# Patient Record
Sex: Male | Born: 2000
Health system: Southern US, Community
[De-identification: ages and names within clinical notes are randomized; demographics above are authoritative.]

---
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
## 2019-02-07 ENCOUNTER — Other Ambulatory Visit: Payer: Self-pay

## 2019-02-07 ENCOUNTER — Emergency Department (HOSPITAL_COMMUNITY)
Admission: EM | Admit: 2019-02-07 | Discharge: 2019-02-07 | Disposition: A | Discharge status: Home / Self Care | Payer: 59 | Attending: Emergency Medicine | Admitting: Emergency Medicine

## 2019-02-07 ENCOUNTER — Encounter (HOSPITAL_COMMUNITY): Payer: Self-pay | Admitting: Emergency Medicine

## 2019-02-07 DIAGNOSIS — Z20822 Contact with and (suspected) exposure to covid-19: Secondary | ICD-10-CM | POA: Insufficient documentation

## 2019-02-07 DIAGNOSIS — H9202 Otalgia, left ear: Secondary | ICD-10-CM | POA: Insufficient documentation

## 2019-02-07 DIAGNOSIS — Z5321 Procedure and treatment not carried out due to patient leaving prior to being seen by health care provider: Secondary | ICD-10-CM | POA: Diagnosis not present

## 2019-02-07 NOTE — ED Triage Notes (Signed)
Patient complains of left ear pain x2 weeks. He has been using OTC ear drops without relief. Patients employer told him he may have been exposed to Covid and he is required to be tested prior to return. No other symptoms at this time.

## 2019-06-02 ENCOUNTER — Other Ambulatory Visit: Payer: Self-pay

## 2019-06-02 ENCOUNTER — Emergency Department (HOSPITAL_COMMUNITY)
Admission: EM | Admit: 2019-06-02 | Discharge: 2019-06-02 | Disposition: A | Discharge status: Home / Self Care | Payer: Self-pay | Attending: Emergency Medicine | Admitting: Emergency Medicine

## 2019-06-02 ENCOUNTER — Encounter (HOSPITAL_COMMUNITY): Payer: Self-pay | Admitting: Emergency Medicine

## 2019-06-02 ENCOUNTER — Emergency Department (HOSPITAL_COMMUNITY): Payer: Self-pay

## 2019-06-02 DIAGNOSIS — R0789 Other chest pain: Secondary | ICD-10-CM | POA: Insufficient documentation

## 2019-06-02 DIAGNOSIS — G4700 Insomnia, unspecified: Secondary | ICD-10-CM | POA: Insufficient documentation

## 2019-06-02 DIAGNOSIS — F329 Major depressive disorder, single episode, unspecified: Secondary | ICD-10-CM | POA: Insufficient documentation

## 2019-06-02 DIAGNOSIS — R079 Chest pain, unspecified: Secondary | ICD-10-CM

## 2019-06-02 DIAGNOSIS — F17290 Nicotine dependence, other tobacco product, uncomplicated: Secondary | ICD-10-CM | POA: Insufficient documentation

## 2019-06-02 DIAGNOSIS — F419 Anxiety disorder, unspecified: Secondary | ICD-10-CM | POA: Insufficient documentation

## 2019-06-02 DIAGNOSIS — Z79899 Other long term (current) drug therapy: Secondary | ICD-10-CM | POA: Insufficient documentation

## 2019-06-02 DIAGNOSIS — F418 Other specified anxiety disorders: Secondary | ICD-10-CM

## 2019-06-02 LAB — BASIC METABOLIC PANEL
Anion gap: 9 (ref 5–15)
BUN: 13 mg/dL (ref 6–20)
CO2: 26 mmol/L (ref 22–32)
Calcium: 9.5 mg/dL (ref 8.9–10.3)
Chloride: 104 mmol/L (ref 98–111)
Creatinine, Ser: 0.88 mg/dL (ref 0.61–1.24)
GFR calc Af Amer: >60 mL/min (ref >60)
GFR calc non Af Amer: >60 mL/min (ref >60)
Glucose, Bld: 88 mg/dL (ref 70–99)
Potassium: 4.2 mmol/L (ref 3.5–5.1)
Sodium: 139 mmol/L (ref 135–145)

## 2019-06-02 LAB — TROPONIN I (HIGH SENSITIVITY)
Troponin I (High Sensitivity): <2 ng/L (ref <18)
Troponin I (High Sensitivity): <2 ng/L (ref <18)

## 2019-06-02 LAB — CBC
HCT: 47.8 % (ref 39.0–52.0)
Hemoglobin: 15.9 g/dL (ref 13.0–17.0)
MCH: 30.8 pg (ref 26.0–34.0)
MCHC: 33.3 g/dL (ref 30.0–36.0)
MCV: 92.6 fL (ref 80.0–100.0)
Platelets: 251 10*3/uL (ref 150–400)
RBC: 5.16 MIL/uL (ref 4.22–5.81)
RDW: 12.1 % (ref 11.5–15.5)
WBC: 4 10*3/uL (ref 4.0–10.5)
nRBC: 0 % (ref 0.0–0.2)

## 2019-06-02 LAB — HIV ANTIBODY (ROUTINE TESTING W REFLEX): HIV Screen 4th Generation wRfx: NONREACTIVE

## 2019-06-02 MED ORDER — HYDROXYZINE HCL 25 MG PO TABS: 25.0000 mg | ORAL_TABLET | Freq: Three times a day (TID) | ORAL | 0 refills | Status: DC | PRN

## 2019-06-02 NOTE — Clinical Social Work Note (Signed)
Transition of Care [facility]) - Emergency Department Mini Assessment  Patient Details  Name: Joseph Mitchell MRN: [id number] Date of Birth: 12/07/2000  Transition of Care [facility]) CM/SW Contact:    Ewing Schlein, LCSW Phone Number: 06/02/2019, 5:49 PM  Clinical Narrative: [facility] received consult for patient requesting outpatient mental health resources for depression and anxiety. CSW provided patient with Medicaid application website and encouraged him to submit an application as many behavioral health providers in the community accept Medicaid. CSW also gave patient a list of outpatient mental health providers for [facility].  ED Mini Assessment: What brought you to the Emergency Department? : Chest pain and left arm tingling  Barriers to Discharge: Inadequate or no insurance, Other (comment)(In need of mental health resources)  Barrier interventions: Provided website to start Medicaid application; provided outpatient behavioral health resources  Means of departure: Car  Interventions which prevented an admission or readmission: Other (must enter comment)(Resources for outpatient mental health services and Medicaid application website)  Admission diagnosis:  chest pain left arm tingling There are no problems to display for this patient.  PCP:  Patient, No Pcp Per Pharmacy:   [id number] - [place], [place] - [address] [place] [address] Phone: [phone] Fax: [phone]

## 2019-06-02 NOTE — Progress Notes (Signed)
CSW has reviewed patients chart and notes that patient is without primary care and/or insurance. CSW will follow up with patient regarding this matter and provide assistance as needed.   Joseph Mitchell M. [name] LCSWA Transitions of Care  Clinical Social Worker  Ph: [phone] 

## 2019-06-02 NOTE — ED Provider Notes (Signed)
[facility] EMERGENCY DEPARTMENT Provider Note   CSN: [id number] Arrival date & time: 06/02/19  1311     History No chief complaint on file.   Joseph Mitchell is a 19 y.o. male with no significant past medical history presenting with multiple complaints.  First he describes a 2-week history of tingling midsternal chest pain which radiates into his left shoulder and at times radiates all the way to his left hand.  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     He also describes night sweats, insomnia and increased anxiety with depression.  He denies suicidal ideation and homicidal ideation. His pain is worsened with certain positions. He describes spending a great deal of time at night with increased anxiety and dwelling on past stressful events in his life.  When he does sleep he frequently wakes with head to toe sweats and anxiety.  He has had no recent illnesses, denies headache, cough, sore throat, sinus issues, nausea or vomiting, also denies abdominal pain, no dysuria, no penile discharge or other possible source of fever. He does report reduced appetite.  He reports he was incarcerated last year during which time he was having some issues with depression.  While incarcerated he was seen by [facility], but he states they were not helpful in any way would not choose to return there for follow-up care.  There is no family history of early cardiac history.  Patient smokes, he denies substance abuse, IVDU, no cocaine use.  He occasionally drinks alcohol.       The history is provided by the patient and a friend (fiance at bedside).       History reviewed. No pertinent past medical history.  There are no problems to display for this patient.   History reviewed. No pertinent surgical history.     Family History  Problem Relation Age of Onset  . Diabetes Other     Social History   Tobacco Use  . Smoking status:  Current Every Day Smoker    Types: Cigars  . Smokeless tobacco: Never Used  Substance Use Topics  . Alcohol use: Yes    Comment: occ  . Drug use: No    Home Medications Prior to Admission medications   Medication Sig Start Date End Date Taking? Authorizing Provider  Aspirin-Salicylamide-Caffeine (BC HEADACHE POWDER PO) Take 1 packet by mouth daily as needed (toothache).    [provider]  doxycycline (VIBRAMYCIN) 100 MG capsule Take 1 capsule (100 mg total) by mouth 2 (two) times daily. One po bid x 7 days 04/24/17   Bethann Berkshire, MD  hydrOXYzine (ATARAX/VISTARIL) 25 MG tablet Take 1 tablet (25 mg total) by mouth every 8 (eight) hours as needed for anxiety. 06/02/19   Angelito Hopping, Raynelle Fanning, PA-C  ibuprofen (ADVIL,MOTRIN) 800 MG tablet Take 1 tablet (800 mg total) by mouth every 8 (eight) hours as needed for moderate pain. 04/24/17   Bethann Berkshire, MD    Allergies    Patient has no known allergies.  Review of Systems   Review of Systems  Constitutional: Positive for appetite change and diaphoresis. Negative for fever.  HENT: Negative for congestion and sore throat.   Eyes: Negative.   Respiratory: Negative for chest tightness and shortness of breath.   Cardiovascular: Positive for chest pain.  Gastrointestinal: Negative for abdominal pain, nausea and vomiting.  Genitourinary: Negative.   Musculoskeletal: Positive for arthralgias. Negative for joint swelling and  neck pain.  Skin: Negative.  Negative for rash and wound.  Neurological: Negative for dizziness, weakness, light-headedness, numbness and headaches.  Psychiatric/Behavioral: Positive for sleep disturbance. Negative for hallucinations and suicidal ideas. The patient is nervous/anxious.     Physical Exam Updated Vital Signs BP 131/73 (BP Location: Right Arm)   Pulse 64   Temp 97.8 F (36.6 C) (Oral)   Resp 17   Ht 6' (1.829 m)   Wt 68 kg   SpO2 98%   BMI 20.34 kg/m   Physical Exam  ED Results / Procedures /  Treatments   Labs (all labs ordered are listed, but only abnormal results are displayed) Labs Reviewed  BASIC METABOLIC PANEL  CBC  GC/CHLAMYDIA PROBE AMP ([place]) NOT AT [facility]  TROPONIN I (HIGH SENSITIVITY)  TROPONIN I (HIGH SENSITIVITY)    EKG EKG Interpretation  Date/Time:  Wednesday June 02 2019 13:28:57 EDT Ventricular Rate:  61 PR Interval:  142 QRS Duration: 92 QT Interval:  370 QTC Calculation: 372 R Axis:   93 Text Interpretation: Normal sinus rhythm with sinus arrhythmia Rightward axis Borderline ECG Confirmed by Carmin Muskrat [phone]) on 06/02/2019 4:31:42 PM   Radiology DG Chest 2 View  Result Date: 06/02/2019 CLINICAL DATA:  Chest pain with night sweats 2 weeks. EXAM: CHEST - 2 VIEW COMPARISON:  12/25/2015 FINDINGS: The heart size and mediastinal contours are within normal limits. Both lungs are clear. The visualized skeletal structures are unremarkable. IMPRESSION: No active cardiopulmonary disease. Electronically Signed   By: Marin Olp M.D.   On: 06/02/2019 14:09    Procedures Procedures (including critical care time)  Medications Ordered in ED Medications - No data to display  ED Course  I have reviewed the triage vital signs and the nursing notes.  Pertinent labs & imaging results that were available during my care of the patient were reviewed by me and considered in my medical decision making (see chart for details).    MDM Rules/Calculators/A&P                      Prior to dc, pt asked for std screening. Denies known exposure also no dysuria, no penile dc, but wants to rule this out as source of night sweats.  Gc/chlamydia, HIV pending.  Suspect chest and left arm pain may be msk source given new activity with job using left arm continuously.  No neuro deficits on exam, also cxr, delta troponins negative - no evidence for cardiopulmonary source.  Suspect most of his sx are driven by anxiety/depression and insomnia.  He and fiance both concur  no SI/HI concern but are desirous of assistance with insomnia and anxiety.  He was seen by [facility] last year but felt they were less than helpful.  I have asked social worker to help with community resources for this patient given limited personal resources.  Also have given general community resource list.  Hydroxyzine prescribed for prn and mostly qhs use for insomnia/anxiety.    Final Clinical Impression(s) / ED Diagnoses Final diagnoses:  Chest pain, unspecified type  Insomnia, unspecified type  Anxiety with depression    Rx / DC Orders ED Discharge Orders         Ordered    hydrOXYzine (ATARAX/VISTARIL) 25 MG tablet  Every 8 hours PRN     06/02/19 1659           Landis Martins 06/02/19 1748    Carmin Muskrat, MD 06/03/19 [phone]

## 2019-06-02 NOTE — ED Triage Notes (Signed)
Tingling in LT arm and neck x2 weeks.  Also reports night sweats and chest pain in center of chest x 2 weeks.

## 2019-06-02 NOTE — Discharge Instructions (Addendum)
Your lab tests, ekg, chest xray and exam are reassuring today.  You have been screened for std's today - this should result by tomorrow and if positive, you will be contacted and prescription given.  Since you are not having symptoms suggesting this, however,  I suspect your test will be negative.   You have been prescribed a short course of a medicine to help with anxiety and insomnia - I recommend taking this before bedtime.  You may take it up to 3 times daily but can make you sleepy so I do not recommend taking it when you need to be awake for work or driving.    Refer to the resource guide printed and recommendations per our social worker.

## 2019-06-04 LAB — GC/CHLAMYDIA PROBE AMP (~~LOC~~) NOT AT ARMC
Chlamydia: NEGATIVE
Comment: NEGATIVE
Comment: NORMAL
Neisseria Gonorrhea: NEGATIVE

## 2019-12-11 ENCOUNTER — Ambulatory Visit
Admission: EM | Admit: 2019-12-11 | Discharge: 2019-12-11 | Disposition: A | Discharge status: Home / Self Care | Payer: Self-pay | Attending: Emergency Medicine | Admitting: Emergency Medicine

## 2019-12-11 ENCOUNTER — Encounter: Payer: Self-pay | Admitting: Emergency Medicine

## 2019-12-11 DIAGNOSIS — K0889 Other specified disorders of teeth and supporting structures: Secondary | ICD-10-CM

## 2019-12-11 MED ORDER — AMOXICILLIN-POT CLAVULANATE 875-125 MG PO TABS: 1.0000 | ORAL_TABLET | Freq: Two times a day (BID) | ORAL | 0 refills | Status: DC

## 2019-12-11 MED ORDER — CHLORHEXIDINE GLUCONATE 0.12 % MT SOLN: 15.0000 mL | Freq: Two times a day (BID) | OROMUCOSAL | 0 refills | Status: DC

## 2019-12-11 MED ORDER — NAPROXEN 500 MG PO TABS: 500.0000 mg | ORAL_TABLET | Freq: Two times a day (BID) | ORAL | 0 refills | Status: DC

## 2019-12-11 NOTE — ED Triage Notes (Signed)
Dental pain to RT lower side x 1 week

## 2019-12-11 NOTE — ED Provider Notes (Signed)
[facility]   [id number] 12/11/19 Arrival Time: 1341  CC: DENTAL PAIN  SUBJECTIVE:  Joseph Mitchell is a 19 y.o. male who presents to the urgent care for complaint of dental pain for the past 1 week.  Denies a precipitating event or trauma.  Localizes pain to right lower gum.  Has tried OTC analgesics without relief.  Worse with chewing.  Does not see a dentist regularly.  Report similar symptoms in the past.  Denies fever, chills, dysphagia, odynophagia, oral or neck swelling, nausea, vomiting, chest pain, SOB.    ROS: As per HPI.  All other pertinent ROS negative.     History reviewed. No pertinent past medical history. History reviewed. No pertinent surgical history. No Known Allergies No current facility-administered medications on file prior to encounter.   Current Outpatient Medications on File Prior to Encounter  Medication Sig Dispense Refill  . Aspirin-Salicylamide-Caffeine (BC HEADACHE POWDER PO) Take 1 packet by mouth daily as needed (toothache).    . hydrOXYzine (ATARAX/VISTARIL) 25 MG tablet Take 1 tablet (25 mg total) by mouth every 8 (eight) hours as needed for anxiety. 12 tablet 0  . ibuprofen (ADVIL,MOTRIN) 800 MG tablet Take 1 tablet (800 mg total) by mouth every 8 (eight) hours as needed for moderate pain. 20 tablet 0   Social History   Socioeconomic History  . Marital status: Single    Spouse name: Not on file  . Number of children: Not on file  . Years of education: Not on file  . Highest education level: Not on file  Occupational History  . Not on file  Tobacco Use  . Smoking status: Current Every Day Smoker    Types: Cigars  . Smokeless tobacco: Never Used  Substance and Sexual Activity  . Alcohol use: Yes    Comment: occ  . Drug use: No  . Sexual activity: Not on file  Other Topics Concern  . Not on file  Social History Narrative  . Not on file   Social Determinants of Health   Financial Resource Strain:   . Difficulty of Paying  Living Expenses: Not on file  Food Insecurity:   . Worried About Programme researcher, broadcasting/film/video in the Last Year: Not on file  . Ran Out of Food in the Last Year: Not on file  Transportation Needs:   . Lack of Transportation (Medical): Not on file  . Lack of Transportation (Non-Medical): Not on file  Physical Activity:   . Days of Exercise per Week: Not on file  . Minutes of Exercise per Session: Not on file  Stress:   . Feeling of Stress : Not on file  Social Connections:   . Frequency of Communication with Friends and Family: Not on file  . Frequency of Social Gatherings with Friends and Family: Not on file  . Attends Religious Services: Not on file  . Active Member of Clubs or Organizations: Not on file  . Attends Banker Meetings: Not on file  . Marital Status: Not on file  Intimate Partner Violence:   . Fear of Current or Ex-Partner: Not on file  . Emotionally Abused: Not on file  . Physically Abused: Not on file  . Sexually Abused: Not on file   Family History  Problem Relation Age of Onset  . Diabetes Other     OBJECTIVE:  Vitals:   12/11/19 1354 12/11/19 1355  BP: 138/84   Pulse: 70   Resp: 19   Temp: 98.6 F (37  C)   TempSrc: Oral   SpO2: 97%   Weight:  165 lb (74.8 kg)  Height:  6' (1.829 m)    Physical Exam Vitals and nursing note reviewed.  Constitutional:      General: He is not in acute distress.    Appearance: Normal appearance. He is normal weight. He is not ill-appearing, toxic-appearing or diaphoretic.  HENT:     Head: Normocephalic.     Right Ear: Tympanic membrane, ear canal and external ear normal. There is no impacted cerumen.     Left Ear: Tympanic membrane, ear canal and external ear normal. There is no impacted cerumen.     Mouth/Throat:     Lips: Pink.     Mouth: Mucous membranes are moist.     Dentition: Abnormal dentition.  Cardiovascular:     Rate and Rhythm: Normal rate and regular rhythm.     Pulses: Normal pulses.     Heart  sounds: Normal heart sounds. No murmur heard.  No friction rub. No gallop.   Pulmonary:     Effort: Pulmonary effort is normal. No respiratory distress.     Breath sounds: Normal breath sounds. No stridor. No wheezing, rhonchi or rales.  Chest:     Chest wall: No tenderness.  Neurological:     Mental Status: He is alert and oriented to person, place, and time.      ASSESSMENT & PLAN:  1. Pain, dental     Meds ordered this encounter  Medications  . chlorhexidine (PERIDEX) 0.12 % solution    Sig: Use as directed 15 mLs in the mouth or throat 2 (two) times daily.    Dispense:  473 mL    Refill:  0  . amoxicillin-clavulanate (AUGMENTIN) 875-125 MG tablet    Sig: Take 1 tablet by mouth every 12 (twelve) hours.    Dispense:  14 tablet    Refill:  0  . naproxen (NAPROSYN) 500 MG tablet    Sig: Take 1 tablet (500 mg total) by mouth 2 (two) times daily.    Dispense:  30 tablet    Refill:  0    Discharge instructions  Naproxen prescribed.  Use as directed for pain relief Augmentin was prescribed Chlorhexidine mouthwash was prescribed Recommend soft diet until evaluated by dentist Maintain oral hygiene care Follow up with dentist as soon as possible for further evaluation and treatment  Return or go to the ED if you have any new or worsening symptoms such as fever, chills, difficulty swallowing, painful swallowing, oral or neck swelling, nausea, vomiting, chest pain, SOB, etc...  Reviewed expectations re: course of current medical issues. Questions answered. Outlined signs and symptoms indicating need for more acute intervention. Patient verbalized understanding. After Visit Summary given.   Durward Parcel, FNP 12/11/19 1410

## 2019-12-11 NOTE — Discharge Instructions (Addendum)
Naproxen prescribed.  Use as directed for pain relief Augmentin was prescribed Chlorhexidine mouthwash was prescribed Recommend soft diet until evaluated by dentist Maintain oral hygiene care Follow up with dentist as soon as possible for further evaluation and treatment  Return or go to the ED if you have any new or worsening symptoms such as fever, chills, difficulty swallowing, painful swallowing, oral or neck swelling, nausea, vomiting, chest pain, SOB, etc..Marland Kitchen

## 2020-08-21 ENCOUNTER — Other Ambulatory Visit: Payer: Self-pay

## 2020-08-21 ENCOUNTER — Ambulatory Visit
Admission: EM | Admit: 2020-08-21 | Discharge: 2020-08-21 | Disposition: A | Discharge status: Home / Self Care | Payer: Self-pay | Attending: Family Medicine | Admitting: Family Medicine

## 2020-08-21 ENCOUNTER — Encounter: Payer: Self-pay | Admitting: Emergency Medicine

## 2020-08-21 DIAGNOSIS — Z202 Contact with and (suspected) exposure to infections with a predominantly sexual mode of transmission: Secondary | ICD-10-CM | POA: Insufficient documentation

## 2020-08-21 NOTE — ED Triage Notes (Signed)
Would like to be screened for std's.  No s/s or known exposures

## 2020-08-21 NOTE — ED Notes (Signed)
Pt had to leave to go to work

## 2020-08-22 LAB — CYTOLOGY, (ORAL, ANAL, URETHRAL) ANCILLARY ONLY
Chlamydia: NEGATIVE
Comment: NEGATIVE
Comment: NEGATIVE
Comment: NORMAL
Neisseria Gonorrhea: NEGATIVE
Trichomonas: NEGATIVE

## 2020-08-22 NOTE — ED Provider Notes (Signed)
Patient left prior to providing seeing patient. Patient was here today for STD testing. Nursing staff notified provider that patient left cytology but had to leave for work. Provider was finishing up with another patient    Bing Neighbors, [place] 08/22/20 [phone]

## 2020-10-23 ENCOUNTER — Emergency Department (HOSPITAL_COMMUNITY): Payer: Self-pay

## 2020-10-23 ENCOUNTER — Other Ambulatory Visit: Payer: Self-pay

## 2020-10-23 ENCOUNTER — Emergency Department (HOSPITAL_COMMUNITY)
Admission: EM | Admit: 2020-10-23 | Discharge: 2020-10-23 | Disposition: A | Discharge status: Home / Self Care | Payer: Self-pay | Attending: Emergency Medicine | Admitting: Emergency Medicine

## 2020-10-23 DIAGNOSIS — F17290 Nicotine dependence, other tobacco product, uncomplicated: Secondary | ICD-10-CM | POA: Insufficient documentation

## 2020-10-23 DIAGNOSIS — T1490XA Injury, unspecified, initial encounter: Secondary | ICD-10-CM

## 2020-10-23 DIAGNOSIS — W500XXA Accidental hit or strike by another person, initial encounter: Secondary | ICD-10-CM | POA: Insufficient documentation

## 2020-10-23 DIAGNOSIS — Z7982 Long term (current) use of aspirin: Secondary | ICD-10-CM | POA: Insufficient documentation

## 2020-10-23 DIAGNOSIS — Y9367 Activity, basketball: Secondary | ICD-10-CM | POA: Insufficient documentation

## 2020-10-23 DIAGNOSIS — M25572 Pain in left ankle and joints of left foot: Secondary | ICD-10-CM | POA: Insufficient documentation

## 2020-10-23 NOTE — Discharge Instructions (Signed)
Call your primary care doctor or specialist as discussed in the next 2-3 days.   Return immediately back to the ER if:  Your symptoms worsen within the next 12-24 hours. You develop new symptoms such as new fevers, persistent vomiting, new pain, shortness of breath, or new weakness or numbness, or if you have any other concerns.  

## 2020-10-23 NOTE — ED Notes (Signed)
Patient ambulated to room with no assistance needed.

## 2020-10-23 NOTE — ED Provider Notes (Signed)
[facility] EMERGENCY DEPARTMENT Provider Note   CSN: [id number] Arrival date & time: 10/23/20  2106     History Chief Complaint  Patient presents with   Ankle Pain    Left    Joseph Mitchell is a 20 y.o. male.  Patient complaining of left ankle pain.  He states he was doing basketball earlier today when somebody fell down onto his left ankle causing severe pain.  Denies loss of consciousness or injury elsewhere.  He states he did injure it several months ago in the past      No past medical history on file.  There are no problems to display for this patient.   No past surgical history on file.     Family History  Problem Relation Age of Onset   Diabetes Other     Social History   Tobacco Use   Smoking status: Every Day    Types: Cigars   Smokeless tobacco: Never  Substance Use Topics   Alcohol use: Yes    Comment: occ   Drug use: No    Home Medications Prior to Admission medications   Medication Sig Start Date End Date Taking? Authorizing Provider  amoxicillin-clavulanate (AUGMENTIN) 875-125 MG tablet Take 1 tablet by mouth every 12 (twelve) hours. Patient not taking: No sig reported 12/11/19   Durward Parcel, FNP  Aspirin-Salicylamide-Caffeine (BC HEADACHE POWDER PO) Take 1 packet by mouth daily as needed (toothache). Patient not taking: Reported on 10/23/2020    [provider]  chlorhexidine (PERIDEX) 0.12 % solution Use as directed 15 mLs in the mouth or throat 2 (two) times daily. Patient not taking: No sig reported 12/11/19   Durward Parcel, FNP  hydrOXYzine (ATARAX/VISTARIL) 25 MG tablet Take 1 tablet (25 mg total) by mouth every 8 (eight) hours as needed for anxiety. Patient not taking: No sig reported 06/02/19   Burgess Amor, PA-C  ibuprofen (ADVIL,MOTRIN) 800 MG tablet Take 1 tablet (800 mg total) by mouth every 8 (eight) hours as needed for moderate pain. Patient not taking: No sig reported 04/24/17   Bethann Berkshire, MD  naproxen  (NAPROSYN) 500 MG tablet Take 1 tablet (500 mg total) by mouth 2 (two) times daily. Patient not taking: No sig reported 12/11/19   Durward Parcel, FNP    Allergies    Patient has no known allergies.  Review of Systems   Review of Systems  Constitutional:  Negative for fever.  HENT:  Negative for ear pain and sore throat.   Eyes:  Negative for pain.  Respiratory:  Negative for cough.   Cardiovascular:  Negative for chest pain.  Gastrointestinal:  Negative for abdominal pain.  Genitourinary:  Negative for flank pain.  Musculoskeletal:  Negative for back pain.  Skin:  Negative for color change and rash.  Neurological:  Negative for syncope.  All other systems reviewed and are negative.  Physical Exam Updated Vital Signs BP 136/72 (BP Location: Right Arm)   Pulse 84   Temp 97.8 F (36.6 C)   Resp 20   Ht 6' (1.829 m)   Wt 74.8 kg   SpO2 97%   BMI 22.38 kg/m   Physical Exam Constitutional:      Appearance: He is well-developed.  HENT:     Head: Normocephalic.     Nose: Nose normal.  Eyes:     Extraocular Movements: Extraocular movements intact.  Cardiovascular:     Rate and Rhythm: Normal rate.  Pulmonary:     Effort:  Pulmonary effort is normal.  Musculoskeletal:     Comments: Pain with range of motion of the left ankle.  Swelling present to left ankle.  Tenderness to medial and lateral malleoli present.  Neurovascularly intact and compartments are otherwise soft.  Skin:    Coloration: Skin is not jaundiced.  Neurological:     Mental Status: He is alert. Mental status is at baseline.    ED Results / Procedures / Treatments   Labs (all labs ordered are listed, but only abnormal results are displayed) Labs Reviewed - No data to display  EKG None  Radiology DG Ankle Left Port  Result Date: 10/23/2020 CLINICAL DATA:  Left lateral ankle pain, injury. EXAM: PORTABLE LEFT ANKLE - 2 VIEW COMPARISON:  None. FINDINGS: There is soft tissue swelling surrounding the  ankle and joint effusion. There is a well corticated density adjacent to the tip of the medial malleolus. This may related to old injury. No definite acute fractures are seen. Joint spaces are well maintained. IMPRESSION: 1. Well corticated density adjacent to the tip of the medial malleolus may be related to old injury. Please correlate clinically. 2. Soft tissue swelling and joint effusion. Electronically Signed   By: Darliss Cheney M.D.   On: 10/23/2020 21:32    Procedures .Ortho Injury Treatment  Date/Time: 10/23/2020 10:53 PM Performed by: Cheryll Cockayne, MD Authorized by: Cheryll Cockayne, MD  Post-procedure neurovascular assessment: post-procedure neurovascularly intact Comments: Placement of air cast to left ankle      Medications Ordered in ED Medications - No data to display  ED Course  I have reviewed the triage vital signs and the nursing notes.  Pertinent labs & imaging results that were available during my care of the patient were reviewed by me and considered in my medical decision making (see chart for details).    MDM Rules/Calculators/A&P                           X-rays show evidence of prior injury with no acute findings per radiology.  Patient placed in an Aircast with improvement of symptoms.  Given crutches.  Advise follow-up with his doctor within the week.  Advised immediate return for worsening pain or any additional concerns.  Final Clinical Impression(s) / ED Diagnoses Final diagnoses:  Injury  Acute left ankle pain    Rx / DC Orders ED Discharge Orders     None        Cheryll Cockayne, MD 10/23/20 2254

## 2020-10-23 NOTE — ED Triage Notes (Signed)
Pt c/o left ankle pain after sustaining injury to while playing basketball yesterday. States a player landed on his leg rotating his ankle inward.

## 2021-07-07 ENCOUNTER — Encounter (HOSPITAL_COMMUNITY): Payer: Self-pay

## 2021-07-07 ENCOUNTER — Other Ambulatory Visit: Payer: Self-pay

## 2021-07-07 ENCOUNTER — Emergency Department (HOSPITAL_COMMUNITY)
Admission: EM | Admit: 2021-07-07 | Discharge: 2021-07-07 | Disposition: A | Discharge status: Home / Self Care | Payer: Self-pay | Attending: Emergency Medicine | Admitting: Emergency Medicine

## 2021-07-07 DIAGNOSIS — Z202 Contact with and (suspected) exposure to infections with a predominantly sexual mode of transmission: Secondary | ICD-10-CM | POA: Insufficient documentation

## 2021-07-07 DIAGNOSIS — Z711 Person with feared health complaint in whom no diagnosis is made: Secondary | ICD-10-CM

## 2021-07-07 DIAGNOSIS — L723 Sebaceous cyst: Secondary | ICD-10-CM | POA: Insufficient documentation

## 2021-07-07 LAB — HIV ANTIBODY (ROUTINE TESTING W REFLEX): HIV Screen 4th Generation wRfx: NONREACTIVE

## 2021-07-07 LAB — URINALYSIS, ROUTINE W REFLEX MICROSCOPIC
Bilirubin Urine: NEGATIVE
Glucose, UA: NEGATIVE mg/dL
Hgb urine dipstick: NEGATIVE
Ketones, ur: 20 mg/dL — AB
Leukocytes,Ua: NEGATIVE
Nitrite: NEGATIVE
Protein, ur: NEGATIVE mg/dL
Specific Gravity, Urine: 1.02 (ref 1.005–1.030)
pH: 6 (ref 5.0–8.0)

## 2021-07-07 NOTE — Discharge Instructions (Addendum)
The nodules on your scrotum are sebaceous (epidermoid) cysts. These are not dangerous and not due to an infection. You can follow up with the Urologist listed below if you would like to discuss having them removed.

## 2021-07-07 NOTE — ED Triage Notes (Signed)
Pt presents with complaints of with bump like areas on scrotum areas. Pt does not know when they appeared. Not very informative.

## 2021-07-07 NOTE — ED Provider Notes (Signed)
   [facility] EMERGENCY DEPARTMENT  Provider Note  CSN: [id number] Arrival date & time: 07/07/21 1275  History No chief complaint on file.   Atharva Dewalt is a 21 y.o. male here with his significant other for evaluation of lesions on his scrotum. Patient states they have been there for a long time, significant other states it has been more recent and she is concerned about STI. Patient denies any pain, discharge or dysuria but wants to be 'checked for everything' to appease her.    Home Medications Prior to Admission medications   Not on File     Allergies    Patient has no known allergies.   Review of Systems   Review of Systems Please see HPI for pertinent positives and negatives  Physical Exam BP (!) 155/89   Pulse 75   Temp 97.8 F (36.6 C) (Oral)   Resp 18   SpO2 100%   Physical Exam Vitals and nursing note reviewed.  Constitutional:      Appearance: Normal appearance.  HENT:     Head: Normocephalic and atraumatic.     Nose: Nose normal.     Mouth/Throat:     Mouth: Mucous membranes are moist.  Eyes:     Extraocular Movements: Extraocular movements intact.     Conjunctiva/sclera: Conjunctivae normal.  Cardiovascular:     Rate and Rhythm: Normal rate.  Pulmonary:     Effort: Pulmonary effort is normal.     Breath sounds: Normal breath sounds.  Abdominal:     General: Abdomen is flat.     Palpations: Abdomen is soft.     Tenderness: There is no abdominal tenderness.  Genitourinary:    Comments: Numerous non-infected sebaceous cysts on scrotum. No ulcers or sores, no penile discharge. No testicular tenderness or swelling.  Musculoskeletal:        General: No swelling. Normal range of motion.     Cervical back: Neck supple.  Skin:    General: Skin is warm and dry.  Neurological:     General: No focal deficit present.     Mental Status: He is alert.  Psychiatric:        Mood and Affect: Mood normal.    ED Results / Procedures / Treatments    EKG None  Procedures Procedures  Medications Ordered in the ED Medications - No data to display  Initial Impression and Plan  Patient with chronic appearing sebaceous cysts on scrotum. No signs of STI, but to address patient's and girlfriend's concerns an STI panel was ordered. Advised he would be called with any positive results. Follow up with Urology regarding his scrotal lesions.   ED Course   Clinical Course as of 07/07/21 0603  Sat Jul 07, 2021  0551 UA is normal.  [CS]    Clinical Course User Index [CS] Pollyann Savoy, MD     MDM Rules/Calculators/A&P Medical Decision Making Problems Addressed: Concern about STD in male without diagnosis: acute illness or injury Sebaceous cyst of scrotum: chronic illness or injury  Amount and/or Complexity of Data Reviewed Labs: ordered. Decision-making details documented in ED Course.    Final Clinical Impression(s) / ED Diagnoses Final diagnoses:  Sebaceous cyst of scrotum  Concern about STD in male without diagnosis    Rx / DC Orders ED Discharge Orders     None        Pollyann Savoy, MD 07/07/21 [phone]

## 2021-07-09 LAB — GC/CHLAMYDIA PROBE AMP (~~LOC~~) NOT AT ARMC
Chlamydia: NEGATIVE
Comment: NEGATIVE
Comment: NORMAL
Neisseria Gonorrhea: NEGATIVE

## 2022-03-03 IMAGING — DX DG ANKLE PORT 2V*L*
3 series · 3 of 3 positions shown · non-contrast
Comparison: None.

CLINICAL DATA: Left lateral ankle pain, injury.

EXAM:
PORTABLE LEFT ANKLE - 2 VIEW

[ankle ap]
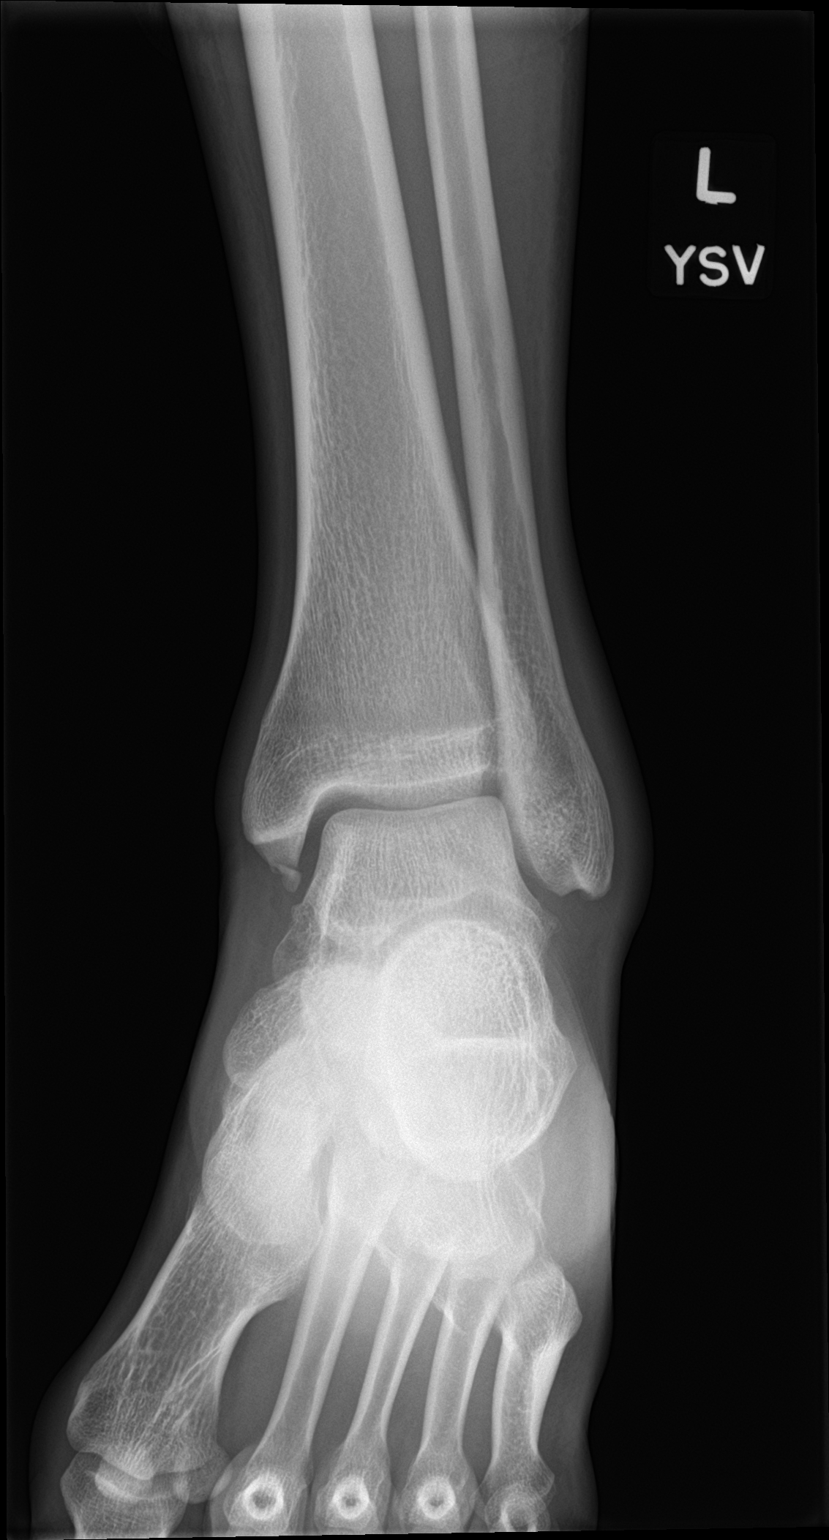

[ankle obl]
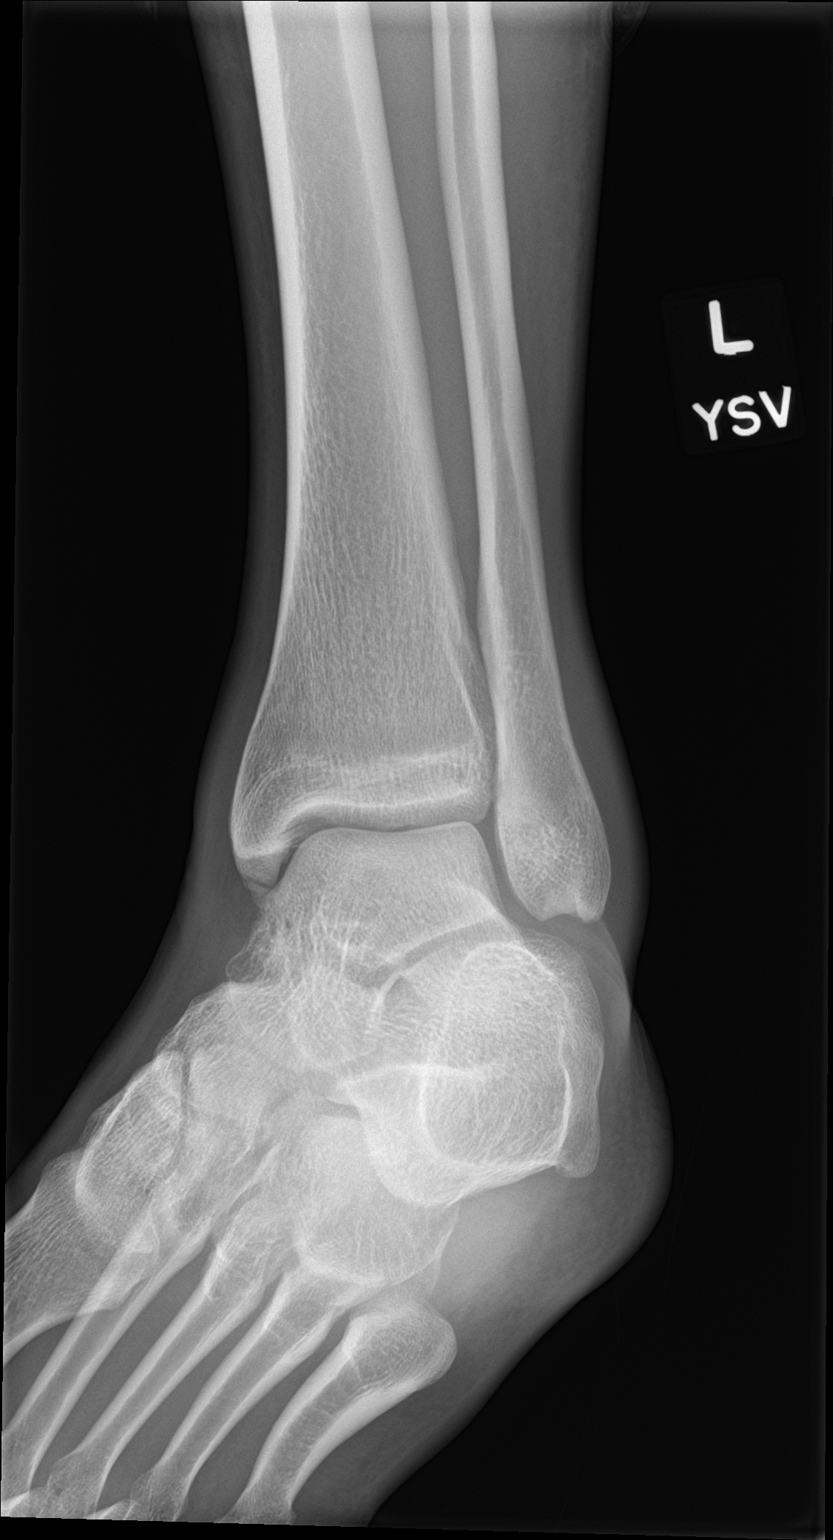

[ankle lat]
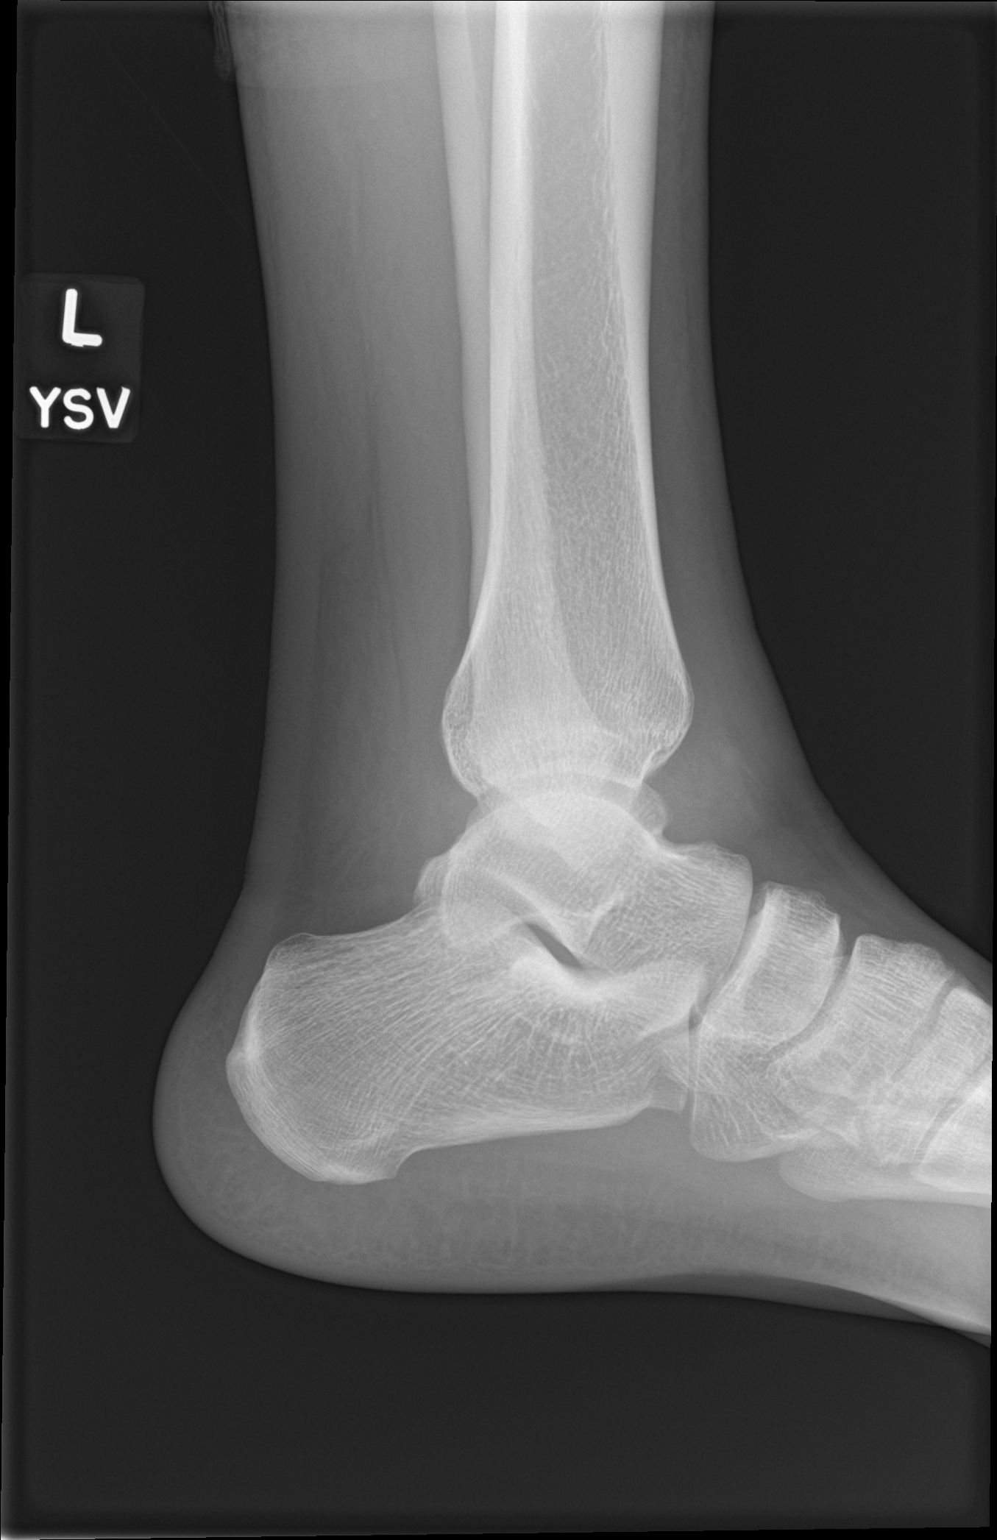

[3 of 3 positions shown; findings below may reference images not displayed]

FINDINGS: There is soft tissue swelling surrounding the ankle and joint
effusion. There is a well corticated density adjacent to the tip of
the medial malleolus. This may related to old injury. No definite
acute fractures are seen. Joint spaces are well maintained.
IMPRESSION: 1. Well corticated density adjacent to the tip of the medial
malleolus may be related to old injury. Please correlate clinically.
2. Soft tissue swelling and joint effusion.

## 2023-01-10 ENCOUNTER — Ambulatory Visit
Admission: EM | Admit: 2023-01-10 | Discharge: 2023-01-10 | Disposition: A | Discharge status: Home / Self Care | Payer: Self-pay | Attending: Nurse Practitioner | Admitting: Nurse Practitioner

## 2023-01-10 DIAGNOSIS — K1370 Unspecified lesions of oral mucosa: Secondary | ICD-10-CM | POA: Insufficient documentation

## 2023-01-10 MED ORDER — VALACYCLOVIR HCL 1 G PO TABS: 2.0000 g | ORAL_TABLET | Freq: Two times a day (BID) | ORAL | 0 refills | Status: AC

## 2023-01-10 NOTE — Discharge Instructions (Signed)
Cytology swab is pending.  You will be contacted if the pending test result is abnormal.  You will also have access to the results via MyChart. Take medication as prescribed. Apply cool compresses to the affected area for the next 24 hours.  Apply for 20 minutes, remove for 1 hour, repeat as much as possible. May take over-the-counter Tylenol or ibuprofen as needed for pain, fever, or general discomfort. Refrain from kissing or drinking after others while symptoms persist. If your symptoms are not improved over the next 24 hours, please follow-up in this clinic or with your primary care physician for further evaluation. Follow-up as needed.

## 2023-01-10 NOTE — ED Provider Notes (Signed)
[facility]    CSN: [id number] Arrival date & time: 01/10/23  1327      History   Chief Complaint Chief Complaint  Patient presents with   Oral Swelling    HPI Joseph Mitchell is a 22 y.o. male.   The history is provided by the patient.   Patient presents for complaints of swelling to the left side of his upper lip that started over the past 24 hours.  Patient states that his girlfriend thought it was a "hair bump" and tried to pull a hair from the area.  Patient states prior to the area on his lip developing, he did have some tingling in the lip.  Patient denies fever, chills, drainage from the site, or recent exposure to STI/STD.  Patient also denies prior history of HSV. Patient states he has been applying warm compresses to the area.    History reviewed. No pertinent past medical history.  There are no problems to display for this patient.   History reviewed. No pertinent surgical history.     Home Medications    Prior to Admission medications   Medication Sig Start Date End Date Taking? Authorizing Provider  valACYclovir (VALTREX) 1000 MG tablet Take 2 tablets (2,000 mg total) by mouth every 12 (twelve) hours for 1 day. 01/10/23 01/11/23 Yes Leath-Warren, Sadie Haber, NP    Family History Family History  Problem Relation Age of Onset   Diabetes Other     Social History Social History   Tobacco Use   Smoking status: Every Day    Types: Cigars   Smokeless tobacco: Never  Substance Use Topics   Alcohol use: Yes    Comment: occ   Drug use: No     Allergies   Patient has no known allergies.   Review of Systems Review of Systems Per HPI  Physical Exam Triage Vital Signs ED Triage Vitals [01/10/23 1340]  Encounter Vitals Group     BP 122/71     Systolic BP Percentile      Diastolic BP Percentile      Pulse Rate (!) 54     Resp 18     Temp 98.8 F (37.1 C)     Temp Source Oral     SpO2 98 %     Weight      Height      Head  Circumference      Peak Flow      Pain Score 5     Pain Loc      Pain Education      Exclude from Growth Chart    No data found.  Updated Vital Signs BP 122/71 (BP Location: Right Arm)   Pulse (!) 54 Comment: states he just smoked a black and mild  Temp 98.8 F (37.1 C) (Oral)   Resp 18   SpO2 98%   Visual Acuity Right Eye Distance:   Left Eye Distance:   Bilateral Distance:    Right Eye Near:   Left Eye Near:    Bilateral Near:     Physical Exam Vitals and nursing note reviewed.  Constitutional:      General: He is not in acute distress.    Appearance: Normal appearance.  HENT:     Head: Normocephalic.     Mouth/Throat:     Lips: Pink.     Mouth: Mucous membranes are moist. Oral lesions present.     Dentition: Normal dentition. Does not have dentures. No dental tenderness,  gingival swelling or dental caries.     Tongue: No lesions.     Pharynx: Oropharynx is clear. Uvula midline.   Eyes:     Extraocular Movements: Extraocular movements intact.     Pupils: Pupils are equal, round, and reactive to light.  Pulmonary:     Effort: Pulmonary effort is normal.  Musculoskeletal:     Cervical back: Normal range of motion.  Skin:    General: Skin is warm and dry.  Neurological:     General: No focal deficit present.     Mental Status: He is alert and oriented to person, place, and time.  Psychiatric:        Mood and Affect: Mood normal.        Behavior: Behavior normal.     UC Treatments / Results  Labs (all labs ordered are listed, but only abnormal results are displayed) Labs Reviewed  HSV CULTURE AND TYPING    EKG   Radiology No results found.  Procedures Procedures (including critical care time)  Medications Ordered in UC Medications - No data to display  Initial Impression / Assessment and Plan / UC Course  I have reviewed the triage vital signs and the nursing notes.  Pertinent labs & imaging results that were available during my care of  the patient were reviewed by me and considered in my medical decision making (see chart for details).  Patient with lesion on the left upper lip.  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  Will treat with valacyclovir 2 g.  HSV culture and typing pending.  Supportive care recommendations were provided and discussed with the patient to include over-the-counter analgesics, refraining from drinking or kissing anyone, and to monitor symptoms for worsening.  Patient advised to follow-up in this clinic or with his PCP if symptoms fail to improve.  Patient was in agreement with this plan of care and verbalizes understanding.  All questions were answered.  Patient stable for discharge.  Final Clinical Impressions(s) / UC Diagnoses   Final diagnoses:  Oral lesion     Discharge Instructions      Cytology swab is pending.  You will be contacted if the pending test result is abnormal.  You will also have access to the results via MyChart. Take medication as prescribed. Apply cool compresses to the affected area for the next 24 hours.  Apply for 20 minutes, remove for 1 hour, repeat as much as possible. May take over-the-counter Tylenol or ibuprofen as needed for pain, fever, or general discomfort. Refrain from kissing or drinking after others while symptoms persist. If your symptoms are not improved over the next 24 hours, please follow-up in this clinic or with your primary care physician for further evaluation. Follow-up as needed.     ED Prescriptions     Medication Sig Dispense Auth. Provider   valACYclovir (VALTREX) 1000 MG tablet Take 2 tablets (2,000 mg total) by mouth every 12 (twelve) hours for 1 day. 4 tablet Leath-Warren, Sadie Haber, NP      PDMP not reviewed this encounter.   Abran Cantor, NP 01/10/23 [phone]

## 2023-01-10 NOTE — ED Triage Notes (Signed)
Pt reports last night his partner noticed his top lip started to swell. This morning he began to feel like his lip was heavy then seen the lump on his lip. He believes something bit him. Size has increased since this morning.   Pt denies trying any new food or medications.

## 2023-01-14 LAB — HSV CULTURE AND TYPING

## 2023-07-02 ENCOUNTER — Other Ambulatory Visit: Payer: Self-pay

## 2023-07-02 ENCOUNTER — Emergency Department (HOSPITAL_COMMUNITY)
Admission: EM | Admit: 2023-07-02 | Discharge: 2023-07-02 | Disposition: A | Discharge status: Home / Self Care | Payer: Self-pay | Attending: Emergency Medicine | Admitting: Emergency Medicine

## 2023-07-02 ENCOUNTER — Encounter (HOSPITAL_COMMUNITY): Payer: Self-pay | Admitting: Emergency Medicine

## 2023-07-02 ENCOUNTER — Emergency Department (HOSPITAL_COMMUNITY): Payer: Self-pay

## 2023-07-02 DIAGNOSIS — Y9389 Activity, other specified: Secondary | ICD-10-CM | POA: Insufficient documentation

## 2023-07-02 DIAGNOSIS — M778 Other enthesopathies, not elsewhere classified: Secondary | ICD-10-CM

## 2023-07-02 DIAGNOSIS — M70831 Other soft tissue disorders related to use, overuse and pressure, right forearm: Secondary | ICD-10-CM | POA: Insufficient documentation

## 2023-07-02 MED ORDER — HYDROCODONE-ACETAMINOPHEN 5-325 MG PO TABS: 1.0000 | ORAL_TABLET | Freq: Four times a day (QID) | ORAL | 0 refills | Status: AC | PRN

## 2023-07-02 MED ORDER — IBUPROFEN 600 MG PO TABS: 600.0000 mg | ORAL_TABLET | Freq: Three times a day (TID) | ORAL | 0 refills | Status: AC

## 2023-07-02 NOTE — ED Triage Notes (Signed)
 Pt c/o of right forearm pain x 5 days. 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 Denies any injuries or falls.

## 2023-07-02 NOTE — ED Provider Notes (Signed)
 [place] EMERGENCY DEPARTMENT AT [facility] Provider Note   CSN: [id number] Arrival date & time: 07/02/23  4098     History  Chief Complaint  Patient presents with   Arm Pain    Joseph Mitchell is a 23 y.o. male presenting for evaluation of right forearm pain and swelling which has been present for approximately 5 days.  He describes working a job which requires repetitive motion with his forearms and has had increasing pain performing his job duties.  He is noticed a localized swelling at the site of maximum pain which is worse with palpation and movement.  He has taken ibuprofen  400 mg and applied ice with no significant improvement.  He denies weakness or numbness or swelling except directly at the site of pain.  Patient is right-handed.  The history is provided by the patient.       Home Medications Prior to Admission medications   Medication Sig Start Date End Date Taking? Authorizing Provider  HYDROcodone -acetaminophen  (NORCO/VICODIN) 5-325 MG tablet Take 1 tablet by mouth every 6 (six) hours as needed for severe pain (pain score 7-10). 07/02/23  Yes April Colter, PA-C  ibuprofen  (ADVIL ) 600 MG tablet Take 1 tablet (600 mg total) by mouth 3 (three) times daily. 07/02/23  Yes Marylyn Appenzeller, PA-C      Allergies    Patient has no known allergies.    Review of Systems   Review of Systems  Constitutional:  Negative for fever.  Musculoskeletal:  Positive for arthralgias. Negative for joint swelling and myalgias.  Skin: Negative.   Neurological:  Negative for weakness and numbness.  All other systems reviewed and are negative.   Physical Exam Updated Vital Signs BP 129/61 (BP Location: Left Arm)   Pulse 60   Temp 98.4 F (36.9 C) (Oral)   Resp 16   Ht 5\' 11"  (1.803 m)   Wt 74.8 kg   SpO2 98%   BMI 23.01 kg/m  Physical Exam Constitutional:      Appearance: He is well-developed.  HENT:     Head: Atraumatic.  Cardiovascular:     Comments: Pulses equal  bilaterally Musculoskeletal:        General: Tenderness present.     Right forearm: Swelling and tenderness present.     Right wrist: Normal. No swelling or deformity.     Cervical back: Normal range of motion.     Comments: Tender to palpation with nodule noted right dorsal distal forearm.  Pain is worsened with right index finger extension, lesser with flexion.  No pain with thumb and long finger ROM.  No crepitus,  skin normal without erythema or lesion.   Skin:    General: Skin is warm and dry.  Neurological:     Mental Status: He is alert.     Sensory: No sensory deficit.     Motor: No weakness.     Deep Tendon Reflexes: Reflexes normal.     ED Results / Procedures / Treatments   Labs (all labs ordered are listed, but only abnormal results are displayed) Labs Reviewed - No data to display  EKG None  Radiology DG Forearm Right Result Date: 07/02/2023 CLINICAL DATA:  Right forearm, two views EXAM: RIGHT FOREARM - 2 VIEW COMPARISON:  None Available. FINDINGS: There is no evidence of fracture or other focal bone lesions. Soft tissues are unremarkable. IMPRESSION: 1. No evidence of acute fracture or malalignment. Electronically Signed   By: Reagan Camera M.D.   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  Suspect extensor tendinitis.  Plain film imaging was obtained, no acute findings with imaging.  Will treat patient with anti-inflammatory, discussed use of ice, he was placed in a wrist splint involving  immobilization of the index finger to rest the tendon as much as possible.  He was advised he will need follow-up with orthopedics and referral was given.  Amount and/or Complexity of Data Reviewed Radiology: ordered.    Details: Reviewed and agree with interpretation, normal imaging.  Risk Prescription drug management.           Final Clinical Impression(s) / ED Diagnoses Final diagnoses:  Tendonitis of elbow or forearm    Rx / DC Orders ED Discharge Orders          Ordered    ibuprofen  (ADVIL ) 600 MG tablet  3 times daily        07/02/23 1041    HYDROcodone -acetaminophen  (NORCO/VICODIN) 5-325 MG tablet  Every 6 hours PRN        07/02/23 1100              Jaesean Litzau, PA-C 07/02/23 1107    Mordecai Applebaum, MD 07/02/23 1149

## 2023-07-02 NOTE — Discharge Instructions (Addendum)
 I am placing you in a splint to help rest this inflamed tendon.  Wear it is much as possible, you may take it off to shower.  You may continue application of ice as much as is comfortable.  Please call Dr. Phyllis Breeze for an office visit for further evaluation and management of this inflamed tendon.

## 2023-08-06 ENCOUNTER — Ambulatory Visit
Admission: RE | Admit: 2023-08-06 | Discharge: 2023-08-06 | Disposition: A | Discharge status: Home / Self Care | Payer: Self-pay | Source: Ambulatory Visit | Attending: Family Medicine | Admitting: Family Medicine

## 2023-08-06 VITALS — BP 120/64 | HR 58 | Temp 98.5°F | Resp 18

## 2023-08-06 DIAGNOSIS — L03011 Cellulitis of right finger: Secondary | ICD-10-CM

## 2023-08-06 DIAGNOSIS — L600 Ingrowing nail: Secondary | ICD-10-CM

## 2023-08-06 MED ORDER — MUPIROCIN 2 % EX OINT: 1.0000 | TOPICAL_OINTMENT | Freq: Two times a day (BID) | CUTANEOUS | 0 refills | Status: AC

## 2023-08-06 MED ORDER — CEPHALEXIN 500 MG PO CAPS: 500.0000 mg | ORAL_CAPSULE | Freq: Four times a day (QID) | ORAL | 0 refills | Status: AC

## 2023-08-06 NOTE — ED Triage Notes (Signed)
 Ingrown nail on right pinky finger since Friday.

## 2023-08-06 NOTE — Discharge Instructions (Signed)
 Take the full course of antibiotics, apply the mupirocin ointment several times daily to the area and you may do warm Epsom salt soaks several times daily until improved.  Follow-up for worsening or unresolving symptoms.

## 2023-08-06 NOTE — ED Provider Notes (Signed)
 [facility]    CSN: [id number] Arrival date & time: 08/06/23  1205      History   Chief Complaint Chief Complaint  Patient presents with   Finger Injury    Ingrown nail in my pinky causing pain whenever I touch just hit it at work on a hose it's little swollen - Entered by patient    HPI Joseph Mitchell is a 23 y.o. male.   Patient presenting today with several day history of progressively worsening pain, swelling to the tip of the right pinky finger and some ingrowing of the nail.  He thinks he got worse when he hit his finger on a hose the other day.  Denies bleeding, drainage, fever, chills, numbness, tingling, decreased range of motion.  So far not trying anything over-the-counter for symptoms.    History reviewed. No pertinent past medical history.  There are no active problems to display for this patient.   History reviewed. No pertinent surgical history.     Home Medications    Prior to Admission medications   Medication Sig Start Date End Date Taking? Authorizing Provider  cephALEXin (KEFLEX) 500 MG capsule Take 1 capsule (500 mg total) by mouth 4 (four) times daily. 08/06/23  Yes Stuart Vernell Norris, PA-C  mupirocin ointment (BACTROBAN) 2 % Apply 1 Application topically 2 (two) times daily. 08/06/23  Yes Stuart Vernell Norris, PA-C  HYDROcodone -acetaminophen  (NORCO/VICODIN) 5-325 MG tablet Take 1 tablet by mouth every 6 (six) hours as needed for severe pain (pain score 7-10). 07/02/23   Idol, Julie, PA-C  ibuprofen  (ADVIL ) 600 MG tablet Take 1 tablet (600 mg total) by mouth 3 (three) times daily. 07/02/23   Birdena Clarity, PA-C    Family History Family History  Problem Relation Age of Onset   Diabetes Other     Social History Social History   Tobacco Use   Smoking status: Every Day    Types: Cigars   Smokeless tobacco: Never  Substance Use Topics   Alcohol use: Yes    Comment: occ   Drug use: No     Allergies   Patient has no known  allergies.   Review of Systems Review of Systems PER HPI  Physical Exam Triage Vital Signs ED Triage Vitals  Encounter Vitals Group     BP 08/06/23 1211 120/64     Girls Systolic BP Percentile --      Girls Diastolic BP Percentile --      Boys Systolic BP Percentile --      Boys Diastolic BP Percentile --      Pulse Rate 08/06/23 1211 (!) 58     Resp 08/06/23 1211 18     Temp 08/06/23 1211 98.5 F (36.9 C)     Temp Source 08/06/23 1211 Oral     SpO2 08/06/23 1211 96 %     Weight --      Height --      Head Circumference --      Peak Flow --      Pain Score 08/06/23 1212 8     Pain Loc --      Pain Education --      Exclude from Growth Chart --    No data found.  Updated Vital Signs BP 120/64 (BP Location: Right Arm)   Pulse (!) 58   Temp 98.5 F (36.9 C) (Oral)   Resp 18   SpO2 96%   Visual Acuity Right Eye Distance:   Left Eye Distance:  Bilateral Distance:    Right Eye Near:   Left Eye Near:    Bilateral Near:     Physical Exam Vitals and nursing note reviewed.  Constitutional:      Appearance: Normal appearance.  HENT:     Head: Atraumatic.     Mouth/Throat:     Mouth: Mucous membranes are moist.  Eyes:     Extraocular Movements: Extraocular movements intact.     Conjunctiva/sclera: Conjunctivae normal.  Cardiovascular:     Rate and Rhythm: Normal rate and regular rhythm.  Pulmonary:     Effort: Pulmonary effort is normal.     Breath sounds: Normal breath sounds.  Musculoskeletal:        General: Normal range of motion.     Cervical back: Normal range of motion and neck supple.  Skin:    General: Skin is warm.     Comments: Slightly ingrown fingernail into the right pinky finger with paronychia forming  Neurological:     Mental Status: He is oriented to person, place, and time.     Comments: Right upper extremity neurovascularly intact  Psychiatric:        Mood and Affect: Mood normal.        Thought Content: Thought content normal.         Judgment: Judgment normal.      UC Treatments / Results  Labs (all labs ordered are listed, but only abnormal results are displayed) Labs Reviewed - No data to display  EKG   Radiology No results found.  Procedures Procedures (including critical care time)  Medications Ordered in UC Medications - No data to display  Initial Impression / Assessment and Plan / UC Course  I have reviewed the triage vital signs and the nursing notes.  Pertinent labs & imaging results that were available during my care of the patient were reviewed by me and considered in my medical decision making (see chart for details).     Treat with Keflex, mupirocin, warm Epsom salt soaks and good home care.  Return for worsening symptoms.  Final Clinical Impressions(s) / UC Diagnoses   Final diagnoses:  Paronychia of finger, right  Ingrown fingernail     Discharge Instructions      Take the full course of antibiotics, apply the mupirocin ointment several times daily to the area and you may do warm Epsom salt soaks several times daily until improved.  Follow-up for worsening or unresolving symptoms.    ED Prescriptions     Medication Sig Dispense Auth. Provider   cephALEXin (KEFLEX) 500 MG capsule Take 1 capsule (500 mg total) by mouth 4 (four) times daily. 20 capsule Stuart Vernell Norris, PA-C   mupirocin ointment (BACTROBAN) 2 % Apply 1 Application topically 2 (two) times daily. 60 g Stuart Vernell Norris, [place]      PDMP not reviewed this encounter.   Stuart Vernell Norris, [place] 08/06/23 1317

## 2023-10-07 ENCOUNTER — Encounter: Payer: Self-pay | Admitting: Emergency Medicine

## 2023-10-07 ENCOUNTER — Other Ambulatory Visit: Payer: Self-pay

## 2023-10-07 ENCOUNTER — Ambulatory Visit
Admission: EM | Admit: 2023-10-07 | Discharge: 2023-10-07 | Disposition: A | Discharge status: Home / Self Care | Payer: Self-pay | Attending: Family Medicine | Admitting: Family Medicine

## 2023-10-07 DIAGNOSIS — J069 Acute upper respiratory infection, unspecified: Secondary | ICD-10-CM

## 2023-10-07 LAB — POC SOFIA SARS ANTIGEN FIA: SARS Coronavirus 2 Ag: NEGATIVE

## 2023-10-07 MED ORDER — PROMETHAZINE-DM 6.25-15 MG/5ML PO SYRP: 5.0000 mL | ORAL_SOLUTION | Freq: Four times a day (QID) | ORAL | 0 refills | Status: AC | PRN

## 2023-10-07 NOTE — ED Provider Notes (Signed)
 [facility]    CSN: [id number] Arrival date & time: 10/07/23  1326      History   Chief Complaint Chief Complaint  Patient presents with   Generalized Body Aches    HPI Joseph Mitchell is a 23 y.o. male.   Patient presenting today with 3-day history of generalized bodyaches, chills, cough, headaches, congestion, fatigue.  Denies chest pain, shortness of breath, abdominal pain, vomiting, diarrhea.  So far trying a humidifier with mild temporary benefit to the congestion.  No known sick contacts recently.    History reviewed. No pertinent past medical history.  There are no active problems to display for this patient.   History reviewed. No pertinent surgical history.     Home Medications    Prior to Admission medications   Medication Sig Start Date End Date Taking? Authorizing Provider  promethazine -dextromethorphan (PROMETHAZINE -DM) 6.25-15 MG/5ML syrup Take 5 mLs by mouth 4 (four) times daily as needed. 10/07/23  Yes Stuart Vernell Norris, PA-C  cephALEXin  (KEFLEX ) 500 MG capsule Take 1 capsule (500 mg total) by mouth 4 (four) times daily. 08/06/23   Stuart Vernell Norris, PA-C  HYDROcodone -acetaminophen  (NORCO/VICODIN) 5-325 MG tablet Take 1 tablet by mouth every 6 (six) hours as needed for severe pain (pain score 7-10). 07/02/23   Idol, Julie, PA-C  ibuprofen  (ADVIL ) 600 MG tablet Take 1 tablet (600 mg total) by mouth 3 (three) times daily. 07/02/23   Idol, Julie, PA-C  mupirocin  ointment (BACTROBAN ) 2 % Apply 1 Application topically 2 (two) times daily. 08/06/23   Stuart Vernell Norris, PA-C    Family History Family History  Problem Relation Age of Onset   Diabetes Other     Social History Social History   Tobacco Use   Smoking status: Every Day    Types: Cigars   Smokeless tobacco: Never  Substance Use Topics   Alcohol use: Yes    Comment: occ   Drug use: No     Allergies   Patient has no known allergies.   Review of Systems Review of  Systems Per HPI  Physical Exam Triage Vital Signs ED Triage Vitals [10/07/23 1340]  Encounter Vitals Group     BP 117/71     Girls Systolic BP Percentile      Girls Diastolic BP Percentile      Boys Systolic BP Percentile      Boys Diastolic BP Percentile      Pulse Rate 71     Resp 20     Temp 98.4 F (36.9 C)     Temp Source Oral     SpO2 92 %     Weight      Height      Head Circumference      Peak Flow      Pain Score 7     Pain Loc      Pain Education      Exclude from Growth Chart    No data found.  Updated Vital Signs BP 117/71 (BP Location: Right Arm)   Pulse 71   Temp 98.4 F (36.9 C) (Oral)   Resp 20   SpO2 92%   Visual Acuity Right Eye Distance:   Left Eye Distance:   Bilateral Distance:    Right Eye Near:   Left Eye Near:    Bilateral Near:     Physical Exam Vitals and nursing note reviewed.  Constitutional:      Appearance: He is well-developed.  HENT:  Head: Atraumatic.     Right Ear: External ear normal.     Left Ear: External ear normal.     Nose: Rhinorrhea present.     Mouth/Throat:     Pharynx: Posterior oropharyngeal erythema present. No oropharyngeal exudate.  Eyes:     Conjunctiva/sclera: Conjunctivae normal.     Pupils: Pupils are equal, round, and reactive to light.  Cardiovascular:     Rate and Rhythm: Normal rate and regular rhythm.  Pulmonary:     Effort: Pulmonary effort is normal. No respiratory distress.     Breath sounds: No wheezing or rales.  Musculoskeletal:        General: Normal range of motion.     Cervical back: Normal range of motion and neck supple.  Lymphadenopathy:     Cervical: No cervical adenopathy.  Skin:    General: Skin is warm and dry.  Neurological:     Mental Status: He is alert and oriented to person, place, and time.  Psychiatric:        Behavior: Behavior normal.     UC Treatments / Results  Labs (all labs ordered are listed, but only abnormal results are displayed) Labs Reviewed   POC SOFIA SARS ANTIGEN FIA    EKG   Radiology No results found.  Procedures Procedures (including critical care time)  Medications Ordered in UC Medications - No data to display  Initial Impression / Assessment and Plan / UC Course  I have reviewed the triage vital signs and the nursing notes.  Pertinent labs & imaging results that were available during my care of the patient were reviewed by me and considered in my medical decision making (see chart for details).     Rapid COVID-negative, suspect viral respiratory infection.  Treat with Phenergan  DM, supportive over-the-counter medications and home care.  Work note given.  Return for worsening symptoms.  Final Clinical Impressions(s) / UC Diagnoses   Final diagnoses:  Viral URI with cough   Discharge Instructions   None    ED Prescriptions     Medication Sig Dispense Auth. Provider   promethazine -dextromethorphan (PROMETHAZINE -DM) 6.25-15 MG/5ML syrup Take 5 mLs by mouth 4 (four) times daily as needed. 100 mL Stuart Vernell Norris, [place]      PDMP not reviewed this encounter.   Stuart Vernell Norris, [place] 10/07/23 1642

## 2023-10-07 NOTE — ED Triage Notes (Signed)
 Pt reports generalized body aches, chills, cough, headache since Saturday. Denies any known exposures. Has tried vicks humidifier.
